# Patient Record
Sex: Male | Born: 1990 | Race: Black or African American | Hispanic: No | Marital: Married | Smoking: Never smoker
Health system: Southern US, Community
[De-identification: ages and names within clinical notes are randomized; demographics above are authoritative.]

## PROBLEM LIST (undated history)

## (undated) DIAGNOSIS — K2970 Gastritis, unspecified, without bleeding: Secondary | ICD-10-CM

---

## 2012-05-29 ENCOUNTER — Encounter (HOSPITAL_BASED_OUTPATIENT_CLINIC_OR_DEPARTMENT_OTHER): Payer: Self-pay | Admitting: *Deleted

## 2012-05-29 ENCOUNTER — Emergency Department (HOSPITAL_BASED_OUTPATIENT_CLINIC_OR_DEPARTMENT_OTHER)
Admission: EM | Admit: 2012-05-29 | Discharge: 2012-05-29 | Disposition: A | Discharge status: Home / Self Care | Payer: Self-pay | Attending: Emergency Medicine | Admitting: Emergency Medicine

## 2012-05-29 DIAGNOSIS — R110 Nausea: Secondary | ICD-10-CM

## 2012-05-29 DIAGNOSIS — K2970 Gastritis, unspecified, without bleeding: Secondary | ICD-10-CM

## 2012-05-29 DIAGNOSIS — E669 Obesity, unspecified: Secondary | ICD-10-CM | POA: Insufficient documentation

## 2012-05-29 LAB — URINALYSIS, ROUTINE W REFLEX MICROSCOPIC
Bilirubin Urine: NEGATIVE
Hgb urine dipstick: NEGATIVE
Ketones, ur: NEGATIVE mg/dL
Nitrite: NEGATIVE
Specific Gravity, Urine: 1.019 (ref 1.005–1.030)
Urobilinogen, UA: 0.2 mg/dL (ref 0.0–1.0)

## 2012-05-29 LAB — CBC WITH DIFFERENTIAL/PLATELET
Basophils Relative: 1 % (ref 0–1)
Eosinophils Absolute: 0.3 10*3/uL (ref 0.0–0.7)
Eosinophils Relative: 5 % (ref 0–5)
Lymphs Abs: 3 10*3/uL (ref 0.7–4.0)
MCH: 28.1 pg (ref 26.0–34.0)
MCHC: 34.9 g/dL (ref 30.0–36.0)
MCV: 80.4 fL (ref 78.0–100.0)
Monocytes Relative: 4 % (ref 3–12)
Neutrophils Relative %: 37 % — ABNORMAL LOW (ref 43–77)
Platelets: 189 10*3/uL (ref 150–400)
RBC: 4.7 MIL/uL (ref 4.22–5.81)

## 2012-05-29 LAB — COMPREHENSIVE METABOLIC PANEL
Albumin: 4 g/dL (ref 3.5–5.2)
BUN: 7 mg/dL (ref 6–23)
Calcium: 9.3 mg/dL (ref 8.4–10.5)
GFR calc Af Amer: >90 mL/min (ref >90)
Glucose, Bld: 86 mg/dL (ref 70–99)
Potassium: 3.7 mEq/L (ref 3.5–5.1)
Sodium: 137 mEq/L (ref 135–145)
Total Protein: 7.6 g/dL (ref 6.0–8.3)

## 2012-05-29 LAB — LIPASE, BLOOD: Lipase: 14 U/L (ref 11–59)

## 2012-05-29 MED ORDER — ONDANSETRON 4 MG PO TBDP: ORAL_TABLET | ORAL | Status: DC

## 2012-05-29 MED ORDER — ONDANSETRON HCL 4 MG/2ML IJ SOLN
4.0000 mg | Freq: Once | INTRAMUSCULAR | Status: AC
  Administered 2012-05-29: 4 mg via INTRAVENOUS
  Filled 2012-05-29: qty 2

## 2012-05-29 MED ORDER — FAMOTIDINE 20 MG PO TABS: 20.0000 mg | ORAL_TABLET | Freq: Every day | ORAL | Status: DC

## 2012-05-29 MED ORDER — SODIUM CHLORIDE 0.9 % IV BOLUS (SEPSIS)
1000.0000 mL | Freq: Once | INTRAVENOUS | Status: AC
  Administered 2012-05-29: 1000 mL via INTRAVENOUS

## 2012-05-29 MED ORDER — GI COCKTAIL ~~LOC~~
30.0000 mL | Freq: Once | ORAL | Status: AC
  Administered 2012-05-29: 30 mL via ORAL
  Filled 2012-05-29: qty 30

## 2012-05-29 MED ORDER — FAMOTIDINE IN NACL 20-0.9 MG/50ML-% IV SOLN
20.0000 mg | Freq: Once | INTRAVENOUS | Status: AC
  Administered 2012-05-29: 20 mg via INTRAVENOUS
  Filled 2012-05-29: qty 50

## 2012-05-29 NOTE — ED Notes (Signed)
Ok to insert IV in foot per Dr. Silverio Lay.

## 2012-05-29 NOTE — ED Notes (Signed)
Attempted IV x 2 unsuccessful for line and labs, MD aware

## 2012-05-29 NOTE — ED Notes (Signed)
Report received. Attempt IV stick times 2 without success.

## 2012-05-29 NOTE — ED Notes (Signed)
Pt describes generalized abd pain x 2 days "burning" Increased reflux and nausea. Took OTC med without relief.

## 2012-05-29 NOTE — ED Notes (Signed)
Per nurse instructions, I removed I.v. from patient's left foot and took another set of vitals.

## 2012-05-29 NOTE — ED Provider Notes (Signed)
History     CSN: [id number]  Arrival date & time 05/29/12  1449   First MD Initiated Contact with Patient 05/29/12 1722      Chief Complaint  Patient presents with  . Abdominal Pain    (Consider location/radiation/quality/duration/timing/severity/associated sxs/prior treatment) The history is provided by the patient.  Jacob Kemp is a 21 y.o. male here with abdominal pain and burning sensation. For a few days, he had diffuse abdominal pain. He also felt nauseous and noticed burning sensation after he eats. He took some motrin and that didn't help. No fever or vomiting. No constipation or diarrhea. No hx of gastric ulcer or reflux or gastritis. He denies eating spicy food.    History reviewed. No pertinent past medical history.  History reviewed. No pertinent past surgical history.  History reviewed. No pertinent family history.  History  Substance Use Topics  . Smoking status: Never Smoker   . Smokeless tobacco: Not on file  . Alcohol Use: No      Review of Systems  Gastrointestinal: Positive for nausea and abdominal pain.  All other systems reviewed and are negative.    Allergies  Review of patient's allergies indicates no known allergies.  Home Medications  No current outpatient prescriptions on file.  BP 145/73  Pulse 86  Temp 98.1 F (36.7 C) (Oral)  Resp 20  Ht 6' (1.829 m)  Wt 230 lb (104.327 kg)  BMI 31.19 kg/m2  SpO2 100%  Physical Exam  Nursing note and vitals reviewed. Constitutional: He is oriented to person, place, and time. He appears well-developed and well-nourished.  HENT:  Head: Normocephalic.  Mouth/Throat: Oropharynx is clear and moist.  Eyes: Conjunctivae normal are normal. Pupils are equal, round, and reactive to light.  Neck: Normal range of motion. Neck supple.  Cardiovascular: Normal rate, regular rhythm and normal heart sounds.   Pulmonary/Chest: Effort normal and breath sounds normal. No respiratory distress. He has no  wheezes.  Abdominal: Soft. Bowel sounds are normal.       Obese, nontender.   Musculoskeletal: Normal range of motion.  Neurological: He is alert and oriented to person, place, and time.  Skin: Skin is warm and dry.  Psychiatric: He has a normal mood and affect. His behavior is normal. Judgment and thought content normal.    ED Course  Procedures (including critical care time)  I performed an ED ultrasound guided peripheral access. I placed 20 gauge IV at R [facility] without complications.   Labs Reviewed  CBC WITH DIFFERENTIAL - Abnormal; Notable for the following:    HCT 37.8 (*)     Neutrophils Relative 37 (*)     Lymphocytes Relative 53 (*)     All other components within normal limits  URINALYSIS, ROUTINE W REFLEX MICROSCOPIC  COMPREHENSIVE METABOLIC PANEL  LIPASE, BLOOD   No results found.   No diagnosis found.    MDM  Jacob Kemp is a 21 y.o. male her with nausea, abdominal pain. Likely has reflux vs early gastroenteritis. Will check labs, give IVF and pepcid and zofran and reassess.   10:31 PM Patient felt well after fluids and meds. Labs nl, UA nl, lipase nl. Will d/c home with pepcid, zofran.        [name], MD 05/29/12 2232

## 2012-05-29 NOTE — ED Notes (Signed)
MD at bedside to obtain lab work

## 2015-02-15 ENCOUNTER — Encounter (HOSPITAL_BASED_OUTPATIENT_CLINIC_OR_DEPARTMENT_OTHER): Payer: Self-pay

## 2015-02-15 ENCOUNTER — Emergency Department (HOSPITAL_BASED_OUTPATIENT_CLINIC_OR_DEPARTMENT_OTHER)
Admission: EM | Admit: 2015-02-15 | Discharge: 2015-02-15 | Disposition: A | Discharge status: Home / Self Care | Payer: BLUE CROSS/BLUE SHIELD | Attending: Emergency Medicine | Admitting: Emergency Medicine

## 2015-02-15 DIAGNOSIS — K219 Gastro-esophageal reflux disease without esophagitis: Secondary | ICD-10-CM

## 2015-02-15 DIAGNOSIS — R12 Heartburn: Secondary | ICD-10-CM | POA: Diagnosis present

## 2015-02-15 HISTORY — DX: Gastritis, unspecified, without bleeding: K29.70

## 2015-02-15 MED ORDER — OMEPRAZOLE 40 MG PO CPDR: 40.0000 mg | DELAYED_RELEASE_CAPSULE | Freq: Every day | ORAL | Status: DC

## 2015-02-15 MED ORDER — PROMETHAZINE HCL 25 MG PO TABS: 25.0000 mg | ORAL_TABLET | Freq: Four times a day (QID) | ORAL | Status: DC | PRN

## 2015-02-15 MED ORDER — PANTOPRAZOLE SODIUM 40 MG PO TBEC
40.0000 mg | DELAYED_RELEASE_TABLET | Freq: Once | ORAL | Status: AC
  Administered 2015-02-15: 40 mg via ORAL
  Filled 2015-02-15: qty 1

## 2015-02-15 MED ORDER — SUCRALFATE 1 G PO TABS: 1.0000 g | ORAL_TABLET | Freq: Three times a day (TID) | ORAL | Status: DC

## 2015-02-15 MED ORDER — GI COCKTAIL ~~LOC~~
30.0000 mL | Freq: Once | ORAL | Status: AC
  Administered 2015-02-15: 30 mL via ORAL
  Filled 2015-02-15: qty 30

## 2015-02-15 MED ORDER — ONDANSETRON 4 MG PO TBDP
4.0000 mg | ORAL_TABLET | Freq: Once | ORAL | Status: AC
  Administered 2015-02-15: 4 mg via ORAL
  Filled 2015-02-15: qty 1

## 2015-02-15 NOTE — ED Notes (Signed)
C/o heartburn, nausea since last night-dx with gastritis "years ago" at [facility]

## 2015-02-15 NOTE — ED Provider Notes (Signed)
TIME SEEN: 8:25 PM  CHIEF COMPLAINT: Heartburn  HPI: Pt is a 24 y.o. male with history of gastritis who presents the emergency department with complaints of heartburn that started last night. Has had nausea as well. States it is a burning sensation in his chest and better so taste in his mouth. Has not taken anything at home for this. Denies vomiting or diarrhea. No bloody stool or melena. No abdominal pain. No shortness of breath. States she's had this before and a GI cocktail has helped.  ROS: See HPI Constitutional: no fever  Eyes: no drainage  ENT: no runny nose   Cardiovascular:  no chest pain  Resp: no SOB  GI: no vomiting GU: no dysuria Integumentary: no rash  Allergy: no hives  Musculoskeletal: no leg swelling  Neurological: no slurred speech ROS otherwise negative  PAST MEDICAL HISTORY/PAST SURGICAL HISTORY:  Past Medical History  Diagnosis Date  . Gastritis     MEDICATIONS:  Prior to Admission medications   Not on File    ALLERGIES:  No Known Allergies  SOCIAL HISTORY:  History  Substance Use Topics  . Smoking status: Never Smoker   . Smokeless tobacco: Not on file  . Alcohol Use: No    FAMILY HISTORY: No family history on file.  EXAM: BP 148/76 mmHg  Pulse 83  Temp(Src) 98.3 F (36.8 C) (Oral)  Resp 18  Ht 5\' 9"  (1.753 m)  Wt 320 lb (145.151 kg)  BMI 47.23 kg/m2  SpO2 97% CONSTITUTIONAL: Alert and oriented and responds appropriately to questions. Well-appearing; well-nourished, nontoxic, no distress HEAD: Normocephalic EYES: Conjunctivae clear, PERRL ENT: normal nose; no rhinorrhea; moist mucous membranes; pharynx without lesions noted NECK: Supple, no meningismus, no LAD  CARD: RRR; S1 and S2 appreciated; no murmurs, no clicks, no rubs, no gallops RESP: Normal chest excursion without splinting or tachypnea; breath sounds clear and equal bilaterally; no wheezes, no rhonchi, no rales, no hypoxia or respiratory distress, speaking full  sentences ABD/GI: Normal bowel sounds; non-distended; soft, non-tender, no rebound, no guarding, no peritoneal signs BACK:  The back appears normal and is non-tender to palpation, there is no CVA tenderness EXT: Normal ROM in all joints; non-tender to palpation; no edema; normal capillary refill; no cyanosis, no calf tenderness or swelling    SKIN: Normal color for age and race; warm NEURO: Moves all extremities equally, sensation to light touch intact diffusely, cranial nerves II through XII intact PSYCH: The patient's mood and manner are appropriate. Grooming and personal hygiene are appropriate.  MEDICAL DECISION MAKING: Patient here with GERD. Abdominal exam benign. I do not think this is ACS or pulmonary embolus. He is hemodynamically stable and afebrile. We'll give GI cocktail, Protonix and Zofran. His EKG shows no ischemic changes or arrhythmia. We'll reassess after these medications.  ED PROGRESS: Patient reports some improvement in symptoms after medications. Will discharge home with prescription for omeprazole, Carafate and Phenergan. We'll give him gastroenterology follow-up information. Discussed return precautions. He verbalizes understanding and is comfortable with plan.     EKG Interpretation  Date/Time:  Thursday February 15 2015 20:09:05 EDT Ventricular Rate:  78 PR Interval:  172 QRS Duration: 84 QT Interval:  372 QTC Calculation: 424 R Axis:   33 Text Interpretation:  Normal sinus rhythm Normal ECG No old tracing to compare Confirmed by WARD,  DO, KRISTEN [phone]) on 02/15/2015 8:09:41 PM        Layla Maw Ward, DO 02/15/15 2108

## 2015-02-15 NOTE — Discharge Instructions (Signed)
Food Choices for Gastroesophageal Reflux Disease When you have gastroesophageal reflux disease (GERD), the foods you eat and your eating habits are very important. Choosing the right foods can help ease the discomfort of GERD. WHAT GENERAL GUIDELINES DO I NEED TO FOLLOW?  Choose fruits, vegetables, whole grains, low-fat dairy products, and low-fat meat, fish, and poultry.  Limit fats such as oils, salad dressings, butter, nuts, and avocado.  Keep a food diary to identify foods that cause symptoms.  Avoid foods that cause reflux. These may be different for different people.  Eat frequent small meals instead of three large meals each day.  Eat your meals slowly, in a relaxed setting.  Limit fried foods.  Cook foods using methods other than frying.  Avoid drinking alcohol.  Avoid drinking large amounts of liquids with your meals.  Avoid bending over or lying down until 2-3 hours after eating. WHAT FOODS ARE NOT RECOMMENDED? The following are some foods and drinks that may worsen your symptoms: Vegetables Tomatoes. Tomato juice. Tomato and spaghetti sauce. Chili peppers. Onion and garlic. Horseradish. Fruits Oranges, grapefruit, and lemon (fruit and juice). Meats High-fat meats, fish, and poultry. This includes hot dogs, ribs, ham, sausage, salami, and bacon. Dairy Whole milk and chocolate milk. Sour cream. Cream. Butter. Ice cream. Cream cheese.  Beverages Coffee and tea, with or without caffeine. Carbonated beverages or energy drinks. Condiments Hot sauce. Barbecue sauce.  Sweets/Desserts Chocolate and cocoa. Donuts. Peppermint and spearmint. Fats and Oils High-fat foods, including French fries and potato chips. Other Vinegar. Strong spices, such as black pepper, white pepper, red pepper, cayenne, curry powder, cloves, ginger, and chili powder. The items listed above may not be a complete list of foods and beverages to avoid. Contact your dietitian for more  information. Document Released: 08/25/2005 Document Revised: 08/30/2013 Document Reviewed: 06/29/2013 Exit[employer] Patient Information 2015 [employer]. This information is not intended to replace advice given to you by your health care provider. Make sure you discuss any questions you have with your health care provider. Gastroesophageal Reflux Disease, Adult Gastroesophageal reflux disease (GERD) happens when acid from your stomach flows up into the esophagus. When acid comes in contact with the esophagus, the acid causes soreness (inflammation) in the esophagus. Over time, GERD may create small holes (ulcers) in the lining of the esophagus. CAUSES   Increased body weight. This puts pressure on the stomach, making acid rise from the stomach into the esophagus.  Smoking. This increases acid production in the stomach.  Drinking alcohol. This causes decreased pressure in the lower esophageal sphincter (valve or ring of muscle between the esophagus and stomach), allowing acid from the stomach into the esophagus.  Late evening meals and a full stomach. This increases pressure and acid production in the stomach.  A malformed lower esophageal sphincter. Sometimes, no cause is found. SYMPTOMS   Burning pain in the lower part of the mid-chest behind the breastbone and in the mid-stomach area. This may occur twice a week or more often.  Trouble swallowing.  Sore throat.  Dry cough.  Asthma-like symptoms including chest tightness, shortness of breath, or wheezing. DIAGNOSIS  Your caregiver may be able to diagnose GERD based on your symptoms. In some cases, X-rays and other tests may be done to check for complications or to check the condition of your stomach and esophagus. TREATMENT  Your caregiver may recommend over-the-counter or prescription medicines to help decrease acid production. Ask your caregiver before starting or adding any new medicines.  HOME CARE   INSTRUCTIONS   Change the  factors that you can control. Ask your caregiver for guidance concerning weight loss, quitting smoking, and alcohol consumption.  Avoid foods and drinks that make your symptoms worse, such as:  Caffeine or alcoholic drinks.  Chocolate.  Peppermint or mint flavorings.  Garlic and onions.  Spicy foods.  Citrus fruits, such as oranges, lemons, or limes.  Tomato-based foods such as sauce, chili, salsa, and pizza.  Fried and fatty foods.  Avoid lying down for the 3 hours prior to your bedtime or prior to taking a nap.  Eat small, frequent meals instead of large meals.  Wear loose-fitting clothing. Do not wear anything tight around your waist that causes pressure on your stomach.  Raise the head of your bed 6 to 8 inches with wood blocks to help you sleep. Extra pillows will not help.  Only take over-the-counter or prescription medicines for pain, discomfort, or fever as directed by your caregiver.  Do not take aspirin, ibuprofen, or other nonsteroidal anti-inflammatory drugs (NSAIDs). SEEK IMMEDIATE MEDICAL CARE IF:   You have pain in your arms, neck, jaw, teeth, or back.  Your pain increases or changes in intensity or duration.  You develop nausea, vomiting, or sweating (diaphoresis).  You develop shortness of breath, or you faint.  Your vomit is green, yellow, black, or looks like coffee grounds or blood.  Your stool is red, bloody, or black. These symptoms could be signs of other problems, such as heart disease, gastric bleeding, or esophageal bleeding. MAKE SURE YOU:   Understand these instructions.  Will watch your condition.  Will get help right away if you are not doing well or get worse. Document Released: 06/04/2005 Document Revised: 11/17/2011 Document Reviewed: 03/14/2011 ExitCare Patient Information 2015 [employer]. This information is not intended to replace advice given to you by your health care provider. Make sure you discuss any questions you have  with your health care provider.  

## 2015-02-27 ENCOUNTER — Emergency Department (HOSPITAL_BASED_OUTPATIENT_CLINIC_OR_DEPARTMENT_OTHER)
Admission: EM | Admit: 2015-02-27 | Discharge: 2015-02-27 | Disposition: A | Discharge status: Home / Self Care | Payer: BLUE CROSS/BLUE SHIELD | Attending: Emergency Medicine | Admitting: Emergency Medicine

## 2015-02-27 ENCOUNTER — Encounter (HOSPITAL_BASED_OUTPATIENT_CLINIC_OR_DEPARTMENT_OTHER): Payer: Self-pay | Admitting: Emergency Medicine

## 2015-02-27 ENCOUNTER — Emergency Department (HOSPITAL_BASED_OUTPATIENT_CLINIC_OR_DEPARTMENT_OTHER): Payer: BLUE CROSS/BLUE SHIELD

## 2015-02-27 DIAGNOSIS — K2100 Gastro-esophageal reflux disease with esophagitis, without bleeding: Secondary | ICD-10-CM

## 2015-02-27 DIAGNOSIS — Z79899 Other long term (current) drug therapy: Secondary | ICD-10-CM | POA: Diagnosis not present

## 2015-02-27 DIAGNOSIS — K219 Gastro-esophageal reflux disease without esophagitis: Secondary | ICD-10-CM | POA: Insufficient documentation

## 2015-02-27 DIAGNOSIS — M549 Dorsalgia, unspecified: Secondary | ICD-10-CM | POA: Insufficient documentation

## 2015-02-27 DIAGNOSIS — R079 Chest pain, unspecified: Secondary | ICD-10-CM | POA: Insufficient documentation

## 2015-02-27 LAB — CBC WITH DIFFERENTIAL/PLATELET
BASOS PCT: 0 % (ref 0–1)
Basophils Absolute: 0 10*3/uL (ref 0.0–0.1)
Eosinophils Absolute: 0.3 10*3/uL (ref 0.0–0.7)
Eosinophils Relative: 4 % (ref 0–5)
HCT: 41.1 % (ref 39.0–52.0)
HEMOGLOBIN: 13.6 g/dL (ref 13.0–17.0)
LYMPHS ABS: 3.4 10*3/uL (ref 0.7–4.0)
LYMPHS PCT: 49 % — AB (ref 12–46)
MCH: 27.5 pg (ref 26.0–34.0)
MCHC: 33.1 g/dL (ref 30.0–36.0)
MCV: 83 fL (ref 78.0–100.0)
MONOS PCT: 6 % (ref 3–12)
Monocytes Absolute: 0.4 10*3/uL (ref 0.1–1.0)
NEUTROS ABS: 2.8 10*3/uL (ref 1.7–7.7)
NEUTROS PCT: 41 % — AB (ref 43–77)
Platelets: 220 10*3/uL (ref 150–400)
RBC: 4.95 MIL/uL (ref 4.22–5.81)
RDW: 12.6 % (ref 11.5–15.5)
WBC: 6.9 10*3/uL (ref 4.0–10.5)

## 2015-02-27 LAB — COMPREHENSIVE METABOLIC PANEL
ALK PHOS: 38 U/L (ref 38–126)
ALT: 21 U/L (ref 17–63)
ANION GAP: 8 (ref 5–15)
AST: 21 U/L (ref 15–41)
Albumin: 3.9 g/dL (ref 3.5–5.0)
BUN: 11 mg/dL (ref 6–20)
CALCIUM: 9.2 mg/dL (ref 8.9–10.3)
CHLORIDE: 101 mmol/L (ref 101–111)
CO2: 29 mmol/L (ref 22–32)
Creatinine, Ser: 0.76 mg/dL (ref 0.61–1.24)
GLUCOSE: 79 mg/dL (ref 65–99)
POTASSIUM: 4.2 mmol/L (ref 3.5–5.1)
SODIUM: 138 mmol/L (ref 135–145)
Total Bilirubin: 0.4 mg/dL (ref 0.3–1.2)
Total Protein: 8.1 g/dL (ref 6.5–8.1)

## 2015-02-27 LAB — TROPONIN I

## 2015-02-27 LAB — LIPASE, BLOOD: Lipase: 12 U/L — ABNORMAL LOW (ref 22–51)

## 2015-02-27 MED ORDER — TRAMADOL HCL 50 MG PO TABS: 50.0000 mg | ORAL_TABLET | Freq: Four times a day (QID) | ORAL | Status: DC | PRN

## 2015-02-27 MED ORDER — SUCRALFATE 1 G PO TABS: 1.0000 g | ORAL_TABLET | Freq: Four times a day (QID) | ORAL | Status: DC

## 2015-02-27 NOTE — ED Notes (Signed)
Pt in c/o chest and back pain x 3 weeks. States pain is more oriented to the left, is sharp, and nothing make it better or worse. Pain does not increase with palpation. States he has had a cough recently.

## 2015-02-27 NOTE — ED Provider Notes (Signed)
CSN: [id number]     Arrival date & time 02/27/15  1736 History  This chart was scribed for Rolland Porter, MD by Octavia Heir, ED Scribe. This patient was seen in room MH11/MH11 and the patient's care was started at 6:20 PM.     Chief Complaint  Patient presents with  . Chest Pain  . Back Pain     Patient is a 24 y.o. male presenting with back pain. The history is provided by the patient. No language interpreter was used.  Back Pain Associated symptoms: no abdominal pain, no chest pain, no dysuria, no fever and no headaches     HPI Comments: Jacob Kemp is a 24 y.o. male who has a hx of gastritis presents to the Emergency Department complaining of intermittent, gradual worsening chest pain onset 3 weeks. Pt notes the pain is sharp and radiates to his back. He has associated shortness of breath that is intermittent. Pt notes when the pain comes, it lasts for about 15-20 minutes. Pt denies any aggravating and modifying factors for pain. Pt notes nothing alleviates the pain. Pt notes being seen here on 6/9 for similar symptoms and was told it was GERD and was given prilosec but reports nothing has changed. Pt is a non-smoker Pt denies abdominal pain, vomiting, etoh and daily caffeine intake.   Past Medical History  Diagnosis Date  . Gastritis    History reviewed. No pertinent past surgical history. History reviewed. No pertinent family history. History  Substance Use Topics  . Smoking status: Never Smoker   . Smokeless tobacco: Not on file  . Alcohol Use: No    Review of Systems  Constitutional: Negative for fever, chills, diaphoresis, appetite change and fatigue.  HENT: Negative for mouth sores, sore throat and trouble swallowing.   Eyes: Negative for visual disturbance.  Respiratory: Negative for cough, chest tightness, shortness of breath and wheezing.   Cardiovascular: Negative for chest pain.  Gastrointestinal: Negative for nausea, vomiting, abdominal pain, diarrhea and abdominal  distention.  Endocrine: Negative for polydipsia, polyphagia and polyuria.  Genitourinary: Negative for dysuria, frequency and hematuria.  Musculoskeletal: Positive for back pain. Negative for gait problem.  Skin: Negative for color change, pallor and rash.  Neurological: Negative for dizziness, syncope, light-headedness and headaches.  Hematological: Does not bruise/bleed easily.  Psychiatric/Behavioral: Negative for behavioral problems and confusion.  All other systems reviewed and are negative.     Allergies  Review of patient's allergies indicates no known allergies.  Home Medications   Prior to Admission medications   Medication Sig Start Date End Date Taking? Authorizing Provider  omeprazole (PRILOSEC) 40 MG capsule Take 1 capsule (40 mg total) by mouth daily. 02/15/15   Kristen N Ward, DO  promethazine (PHENERGAN) 25 MG tablet Take 1 tablet (25 mg total) by mouth every 6 (six) hours as needed for nausea or vomiting. 02/15/15   Kristen N Ward, DO  sucralfate (CARAFATE) 1 G tablet Take 1 tablet (1 g total) by mouth 4 (four) times daily. 02/27/15   Rolland Porter, MD  traMADol (ULTRAM) 50 MG tablet Take 1 tablet (50 mg total) by mouth every 6 (six) hours as needed. 02/27/15   Rolland Porter, MD    Triage vitals: BP 142/90 mmHg  Pulse 73  Temp(Src) 98.7 F (37.1 C) (Oral)  Resp 16  Ht 5\' 10"  (1.778 m)  Wt 317 lb (143.79 kg)  BMI 45.48 kg/m2  SpO2 100% Physical Exam  Constitutional: He is oriented to person, place, and time. He  appears well-developed and well-nourished. No distress.  HENT:  Head: Normocephalic.  Eyes: Conjunctivae are normal. Pupils are equal, round, and reactive to light. No scleral icterus.  Neck: Normal range of motion. Neck supple. No thyromegaly present.  Cardiovascular: Normal rate and regular rhythm.  Exam reveals no gallop and no friction rub.   No murmur heard. Pulmonary/Chest: Effort normal and breath sounds normal. No respiratory distress. He has no wheezes.  He has no rales.  Points to mid sternum, non tender normal exam  Abdominal: Soft. Bowel sounds are normal. He exhibits no distension. There is no tenderness. There is no rebound.  Morbidly obese, no tenderness,  Musculoskeletal: Normal range of motion.  Neurological: He is alert and oriented to person, place, and time.  Skin: Skin is warm and dry. No rash noted.  Psychiatric: He has a normal mood and affect. His behavior is normal.  Nursing note and vitals reviewed.   ED Course  Procedures  DIAGNOSTIC STUDIES: Oxygen Saturation is 100% on RA, normal by my interpretation.  COORDINATION OF CARE:  6:26 PM Discussed treatment plan which includes x-ray, repeat ekg and lab work with pt at bedside and pt agreed to plan.  Labs Review Labs Reviewed  CBC WITH DIFFERENTIAL/PLATELET - Abnormal; Notable for the following:    Neutrophils Relative % 41 (*)    Lymphocytes Relative 49 (*)    All other components within normal limits  LIPASE, BLOOD - Abnormal; Notable for the following:    Lipase 12 (*)    All other components within normal limits  COMPREHENSIVE METABOLIC PANEL  TROPONIN I    Imaging Review Dg Chest 2 View  02/27/2015   CLINICAL DATA:  Left-sided chest pain since June 9th. Shortness of breath. Initial encounter.  EXAM: CHEST  2 VIEW  COMPARISON:  None.  FINDINGS: Normal heart size and mediastinal contours. No acute infiltrate or edema. No effusion or pneumothorax. No acute osseous findings.  IMPRESSION: Negative chest.   Electronically Signed   By: Marnee Spring M.D.   On: 02/27/2015 18:10     EKG Interpretation None      MDM   Final diagnoses:  Chest pain, unspecified chest pain type  Gastroesophageal reflux disease with esophagitis   I personally performed the services described in this documentation, which was scribed in my presence. The recorded information has been reviewed and is accurate.   Rolland Porter, MD 02/27/15 [phone]

## 2015-02-27 NOTE — ED Notes (Signed)
Pt and ?wife report symptoms x 4 days.  Also report being seen here and [facility] for same.  Also seen by gastroenterologist yesterday and has an endoscopy scheduled.  Wife curtly while texting "we need to know whats going on.  I discussed with both that in an ED setting that might not be possible .

## 2015-02-27 NOTE — Discharge Instructions (Signed)
Gastroesophageal Reflux Disease, Adult Gastroesophageal reflux disease (GERD) happens when acid from your stomach goes into your food pipe (esophagus). The acid can cause a burning feeling in your chest. Over time, the acid can make small holes (ulcers) in your food pipe.  HOME CARE  Ask your doctor for advice about:  Losing weight.  Quitting smoking.  Alcohol use.  Avoid foods and drinks that make your problems worse. You may want to avoid:  Caffeine and alcohol.  Chocolate.  Mints.  Garlic and onions.  Spicy foods.  Citrus fruits, such as oranges, lemons, or limes.  Foods that contain tomato, such as sauce, chili, salsa, and pizza.  Fried and fatty foods.  Avoid lying down for 3 hours before you go to bed or before you take a nap.  Eat small meals often, instead of large meals.  Wear loose-fitting clothing. Do not wear anything tight around your waist.  Raise (elevate) the head of your bed 6 to 8 inches with wood blocks. Using extra pillows does not help.  Only take medicines as told by your doctor.  Do not take aspirin or ibuprofen. GET HELP RIGHT AWAY IF:   You have pain in your arms, neck, jaw, teeth, or back.  Your pain gets worse or changes.  You feel sick to your stomach (nauseous), throw up (vomit), or sweat (diaphoresis).  You feel short of breath, or you pass out (faint).  Your throw up is green, yellow, black, or looks like coffee grounds or blood.  Your poop (stool) is red, bloody, or black. MAKE SURE YOU:   Understand these instructions.  Will watch your condition.  Will get help right away if you are not doing well or get worse. Document Released: 02/11/2008 Document Revised: 11/17/2011 Document Reviewed: 03/14/2011 [facility] Patient Information 2015 [place], [place]. This information is not intended to replace advice given to you by your health care provider. Make sure you discuss any questions you have with your health care  provider.  Esophagitis Esophagitis is inflammation of the esophagus. It can involve swelling, soreness, and pain in the esophagus. This condition can make it difficult and painful to swallow. CAUSES  Most causes of esophagitis are not serious. Many different factors can cause esophagitis, including:  Gastroesophageal reflux disease (GERD). This is when acid from your stomach flows up into the esophagus.  Recurrent vomiting.  An allergic-type reaction.  Certain medicines, especially those that come in large pills.  Ingestion of harmful chemicals, such as household cleaning products.  Heavy alcohol use.  An infection of the esophagus.  Radiation treatment for cancer.  Certain diseases such as sarcoidosis, Crohn's disease, and scleroderma. These diseases may cause recurrent esophagitis. SYMPTOMS   Trouble swallowing.  Painful swallowing.  Chest pain.  Difficulty breathing.  Nausea.  Vomiting.  Abdominal pain. DIAGNOSIS  Your caregiver will take your history and do a physical exam. Depending upon what your caregiver finds, certain tests may also be done, including:  Barium X-ray. You will drink a solution that coats the esophagus, and X-rays will be taken.  Endoscopy. A lighted tube is put down the esophagus so your caregiver can examine the area.  Allergy tests. These can sometimes be arranged through follow-up visits. TREATMENT  Treatment will depend on the cause of your esophagitis. In some cases, steroids or other medicines may be given to help relieve your symptoms or to treat the underlying cause of your condition. Medicines that may be recommended include:  Viscous lidocaine, to soothe the esophagus.  Antacids.  Acid reducers.  Proton pump inhibitors.  Antiviral medicines for certain viral infections of the esophagus.  Antifungal medicines for certain fungal infections of the esophagus.  Antibiotic medicines, depending on the cause of the  esophagitis. HOME CARE INSTRUCTIONS   Avoid foods and drinks that seem to make your symptoms worse.  Eat small, frequent meals instead of large meals.  Avoid eating for the 3 hours prior to your bedtime.  If you have trouble taking pills, use a pill splitter to decrease the size and likelihood of the pill getting stuck or injuring the esophagus on the way down. Drinking water after taking a pill also helps.  Stop smoking if you smoke.  Maintain a healthy weight.  Wear loose-fitting clothing. Do not wear anything tight around your waist that causes pressure on your stomach.  Raise the head of your bed 6 to 8 inches with wood blocks to help you sleep. Extra pillows will not help.  Only take over-the-counter or prescription medicines as directed by your caregiver. SEEK IMMEDIATE MEDICAL CARE IF:  You have severe chest pain that radiates into your arm, neck, or jaw.  You feel sweaty, dizzy, or lightheaded.  You have shortness of breath.  You vomit blood.  You have difficulty or pain with swallowing.  You have bloody or black, tarry stools.  You have a fever.  You have a burning sensation in the chest more than 3 times a week for more than 2 weeks.  You cannot swallow, drink, or eat.  You drool because you cannot swallow your saliva. MAKE SURE YOU:  Understand these instructions.  Will watch your condition.  Will get help right away if you are not doing well or get worse. Document Released: 10/02/2004 Document Revised: 11/17/2011 Document Reviewed: 04/25/2011 [facility] Patient Information 2015 [place], [place]. This information is not intended to replace advice given to you by your health care provider. Make sure you discuss any questions you have with your health care provider.

## 2016-01-18 ENCOUNTER — Encounter (HOSPITAL_BASED_OUTPATIENT_CLINIC_OR_DEPARTMENT_OTHER): Payer: Self-pay

## 2016-01-18 ENCOUNTER — Emergency Department (HOSPITAL_BASED_OUTPATIENT_CLINIC_OR_DEPARTMENT_OTHER): Payer: Self-pay

## 2016-01-18 ENCOUNTER — Emergency Department (HOSPITAL_BASED_OUTPATIENT_CLINIC_OR_DEPARTMENT_OTHER)
Admission: EM | Admit: 2016-01-18 | Discharge: 2016-01-18 | Disposition: A | Discharge status: Home / Self Care | Payer: Self-pay | Attending: Emergency Medicine | Admitting: Emergency Medicine

## 2016-01-18 DIAGNOSIS — M79662 Pain in left lower leg: Secondary | ICD-10-CM | POA: Insufficient documentation

## 2016-01-18 DIAGNOSIS — M79605 Pain in left leg: Secondary | ICD-10-CM

## 2016-01-18 MED ORDER — IBUPROFEN 800 MG PO TABS: 800.0000 mg | ORAL_TABLET | Freq: Three times a day (TID) | ORAL | Status: DC

## 2016-01-18 NOTE — Discharge Instructions (Signed)

## 2016-01-18 NOTE — ED Notes (Signed)
C/i left calf pain "for weeks"-denies injury_NAD- steady gait

## 2016-01-18 NOTE — ED Provider Notes (Signed)
CSN: [id number]     Arrival date & time 01/18/16  1111 History   First MD Initiated Contact with Patient 01/18/16 1129     Chief Complaint  Patient presents with  . Leg Pain   HPI  Mr. Zahler is a 25 year old male with PMHx of gastritis presenting with leg pain. Onset of symptoms was 3 weeks ago. The pain is located in the posterior left calf. The pain does not radiate. He describes the pain as a cramping sensation. The pain is constant. Ambulating exacerbates the pain. Denies alleviating factors. He has tried tylenol without improvement. Denies known injury to the calf. Denies recent over-exertion or new work out regimes. He reports intermittent tingling in the left foot. Denies weakness, numbness, loss of sensation or color change in the left leg. He reports a family history of blood clots but no personal history. Denies recent immobilization, long travel, unilateral calf swelling, chest pain, SOB or palpitations. He has no other complaints today.  Past Medical History  Diagnosis Date  . Gastritis    History reviewed. No pertinent past surgical history. No family history on file. Social History  Substance Use Topics  . Smoking status: Never Smoker   . Smokeless tobacco: None  . Alcohol Use: No    Review of Systems  All other systems reviewed and are negative.  Allergies  Review of patient's allergies indicates no known allergies.  Home Medications   Prior to Admission medications   Medication Sig Start Date End Date Taking? Authorizing Provider  ibuprofen (ADVIL,MOTRIN) 800 MG tablet Take 1 tablet (800 mg total) by mouth 3 (three) times daily. 01/18/16   Javiana Anwar, PA-C   BP 119/78 mmHg  Pulse 64  Temp(Src) 99.2 F (37.3 C) (Oral)  Resp 20  Ht  (1.753 m)  Wt 142.883 kg  BMI 46.50 kg/m2  SpO2 100% Physical Exam  Constitutional: He appears well-developed and well-nourished. No distress.  HENT:  Head: Normocephalic and atraumatic.  Eyes: Conjunctivae are normal.  Right eye exhibits no discharge. Left eye exhibits no discharge. No scleral icterus.  Neck: Normal range of motion.  Cardiovascular: Normal rate, regular rhythm, normal heart sounds and intact distal pulses.   Pedal pulse palpable  Pulmonary/Chest: Effort normal. No respiratory distress.  Musculoskeletal: Normal range of motion.       Left lower leg: He exhibits tenderness. He exhibits no swelling.       Legs: TTP of posterior left calf. No erythema, warmth or palpable cords. No unilateral swelling. FROM at the left knee, ankle and toes intact. Negative Homan's. Pt ambulates with a steady gait.   Neurological: He is alert. Coordination normal.  5/5 strength of BLE. Sensation to light touch intact over lower legs and feet  Skin: Skin is warm and dry.  No color or temperature changes over the left foot  Psychiatric: He has a normal mood and affect. His behavior is normal.  Nursing note and vitals reviewed.   ED Course  Procedures (including critical care time) Labs Review Labs Reviewed - No data to display  Imaging Review US Venous Img Lower Unilateral Left  01/18/2016  CLINICAL DATA:  One month history of left calf region pain EXAM: LEFT LOWER EXTREMITY VENOUS DUPLEX ULTRASOUND TECHNIQUE: Gray-scale sonography with graded compression, as well as color Doppler and duplex ultrasound were performed to evaluate the left lower extremity deep venous system from the level of the common femoral vein and including the common femoral, femoral, profunda femoral, popliteal  and calf veins including the posterior tibial, peroneal and gastrocnemius veins when visible. The superficial great saphenous vein was also interrogated. Spectral Doppler was utilized to evaluate flow at rest and with distal augmentation maneuvers in the common femoral, femoral and popliteal veins. COMPARISON:  None. FINDINGS: Contralateral Common Femoral Vein: Respiratory phasicity is normal and symmetric with the symptomatic side. No  evidence of thrombus. Normal compressibility. Common Femoral Vein: No evidence of thrombus. Normal compressibility, respiratory phasicity and response to augmentation. Saphenofemoral Junction: No evidence of thrombus. Normal compressibility and flow on color Doppler imaging. Profunda Femoral Vein: No evidence of thrombus. Normal compressibility and flow on color Doppler imaging. Femoral Vein: No evidence of thrombus. Normal compressibility, respiratory phasicity and response to augmentation. Popliteal Vein: No evidence of thrombus. Normal compressibility, respiratory phasicity and response to augmentation. Calf Veins: No evidence of thrombus. Normal compressibility and flow on color Doppler imaging. Superficial Great Saphenous Vein: No evidence of thrombus. Normal compressibility and flow on color Doppler imaging. Venous Reflux:  None. Other Findings:  None. IMPRESSION: No evidence of left lower extremity deep venous thrombosis. Right common femoral vein also patent. Electronically Signed   By: Bretta BangWilliam  Woodruff III M.D.   On: 01/18/2016 14:20   I have personally reviewed and evaluated these images and lab results as part of my medical decision-making.   EKG Interpretation None      MDM   Final diagnoses:  Left leg pain   Patient presenting with left calf pain x 3 weeks. Left lower leg is neurovascularly intact with FROM. TTP over left posterior calf. No bony or joint tenderness. No unilateral swelling, erythema or warmth. Negative Homan's. Ultrasound negative for DVT. Pain managed in ED with ibuprofen. Pt is able to ambulate with a steady gait. Conservative therapy recommended at this time. Pt advised to follow up with PCP or orthopedics if symptoms persist. Return precautions discussed at bedside and given in discharge paperwork. Pt is stable for discharge.     Rolm GalaStevi Avaleigh Decuir, PA-C 01/18/16 1511  Rolan BuccoMelanie Belfi, MD 01/18/16 1513

## 2016-12-02 IMAGING — DX DG CHEST 2V
2 series · 2 of 2 positions shown · non-contrast
Comparison: None.

CLINICAL DATA: Left-sided chest pain since [REDACTED]. Shortness of
breath. Initial encounter.

EXAM:
CHEST  2 VIEW

[chest pa]
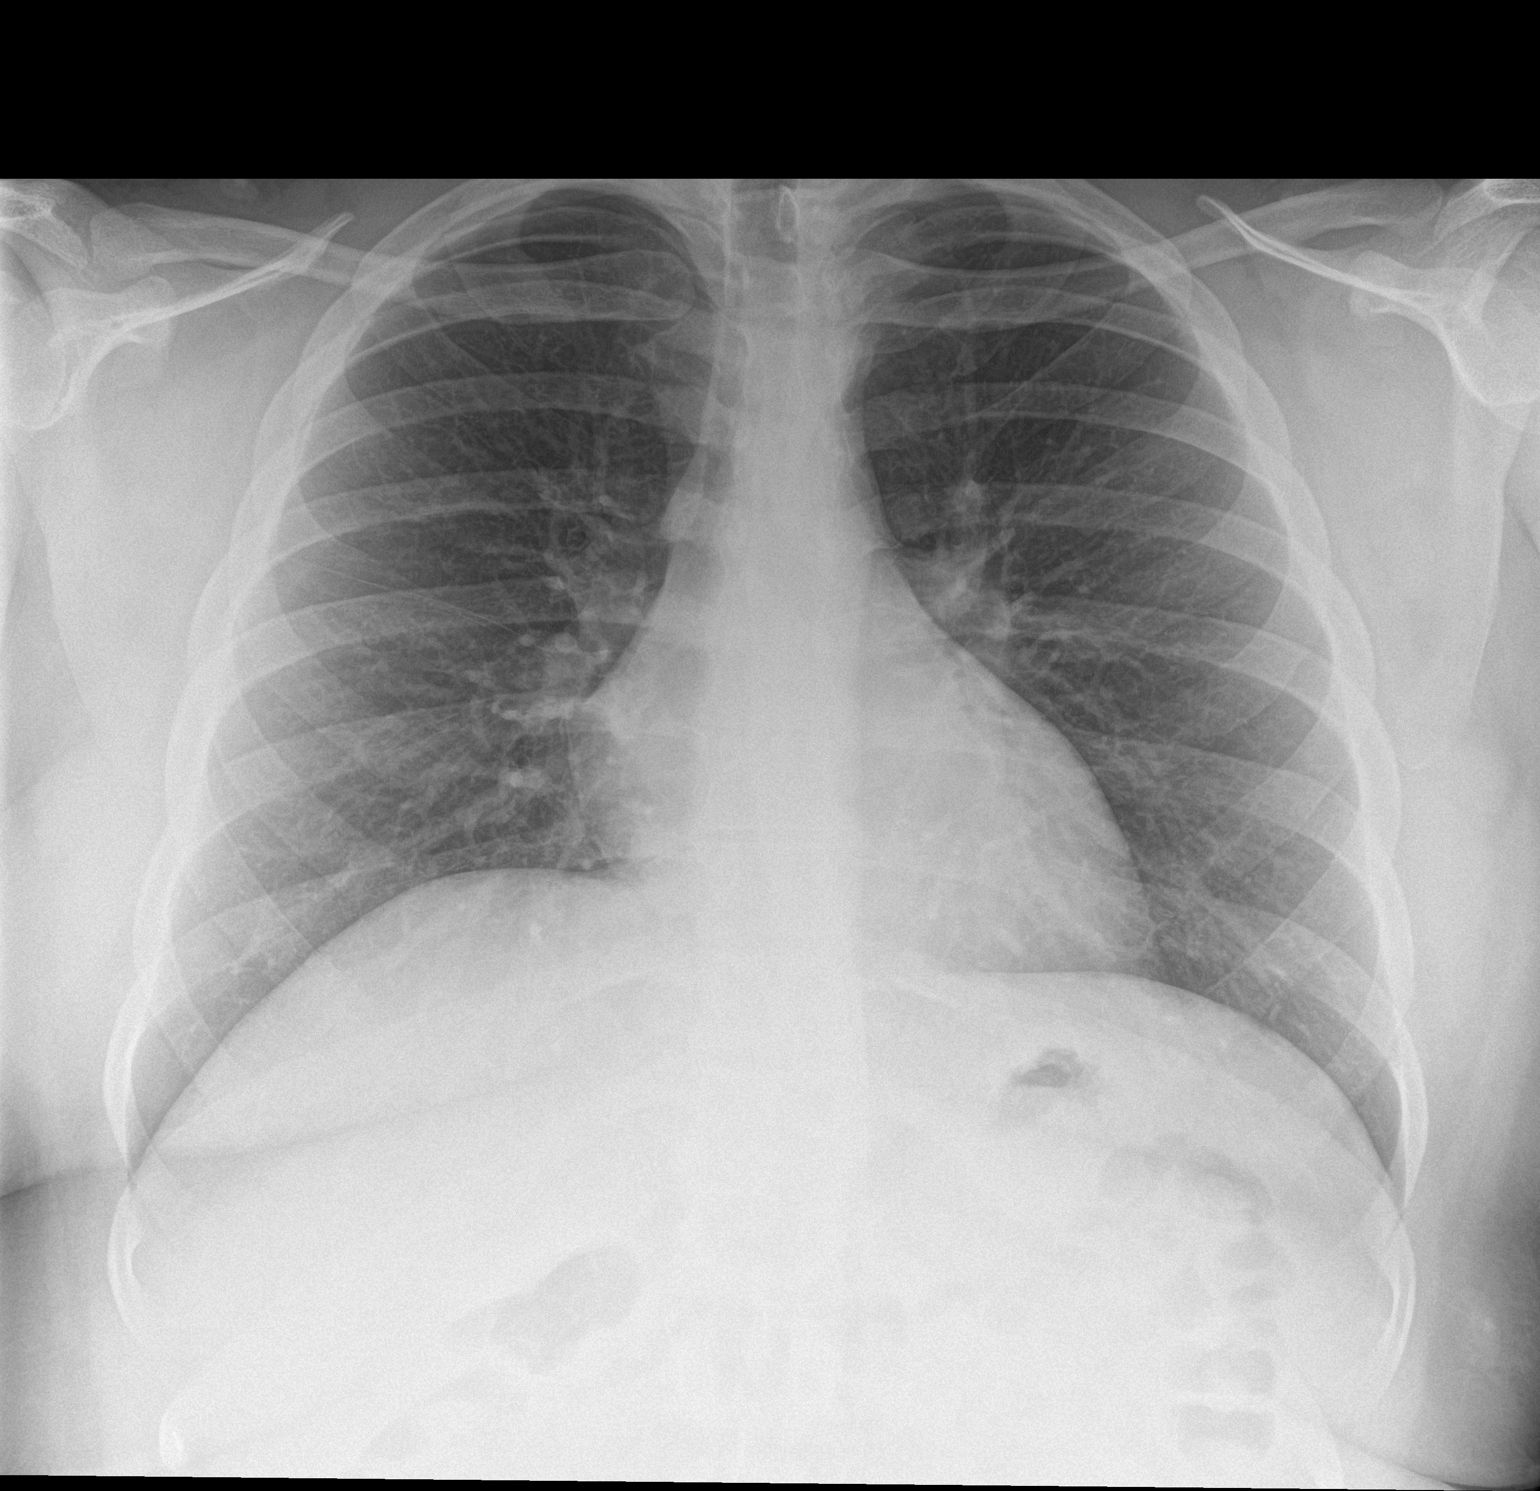

[chest lat]
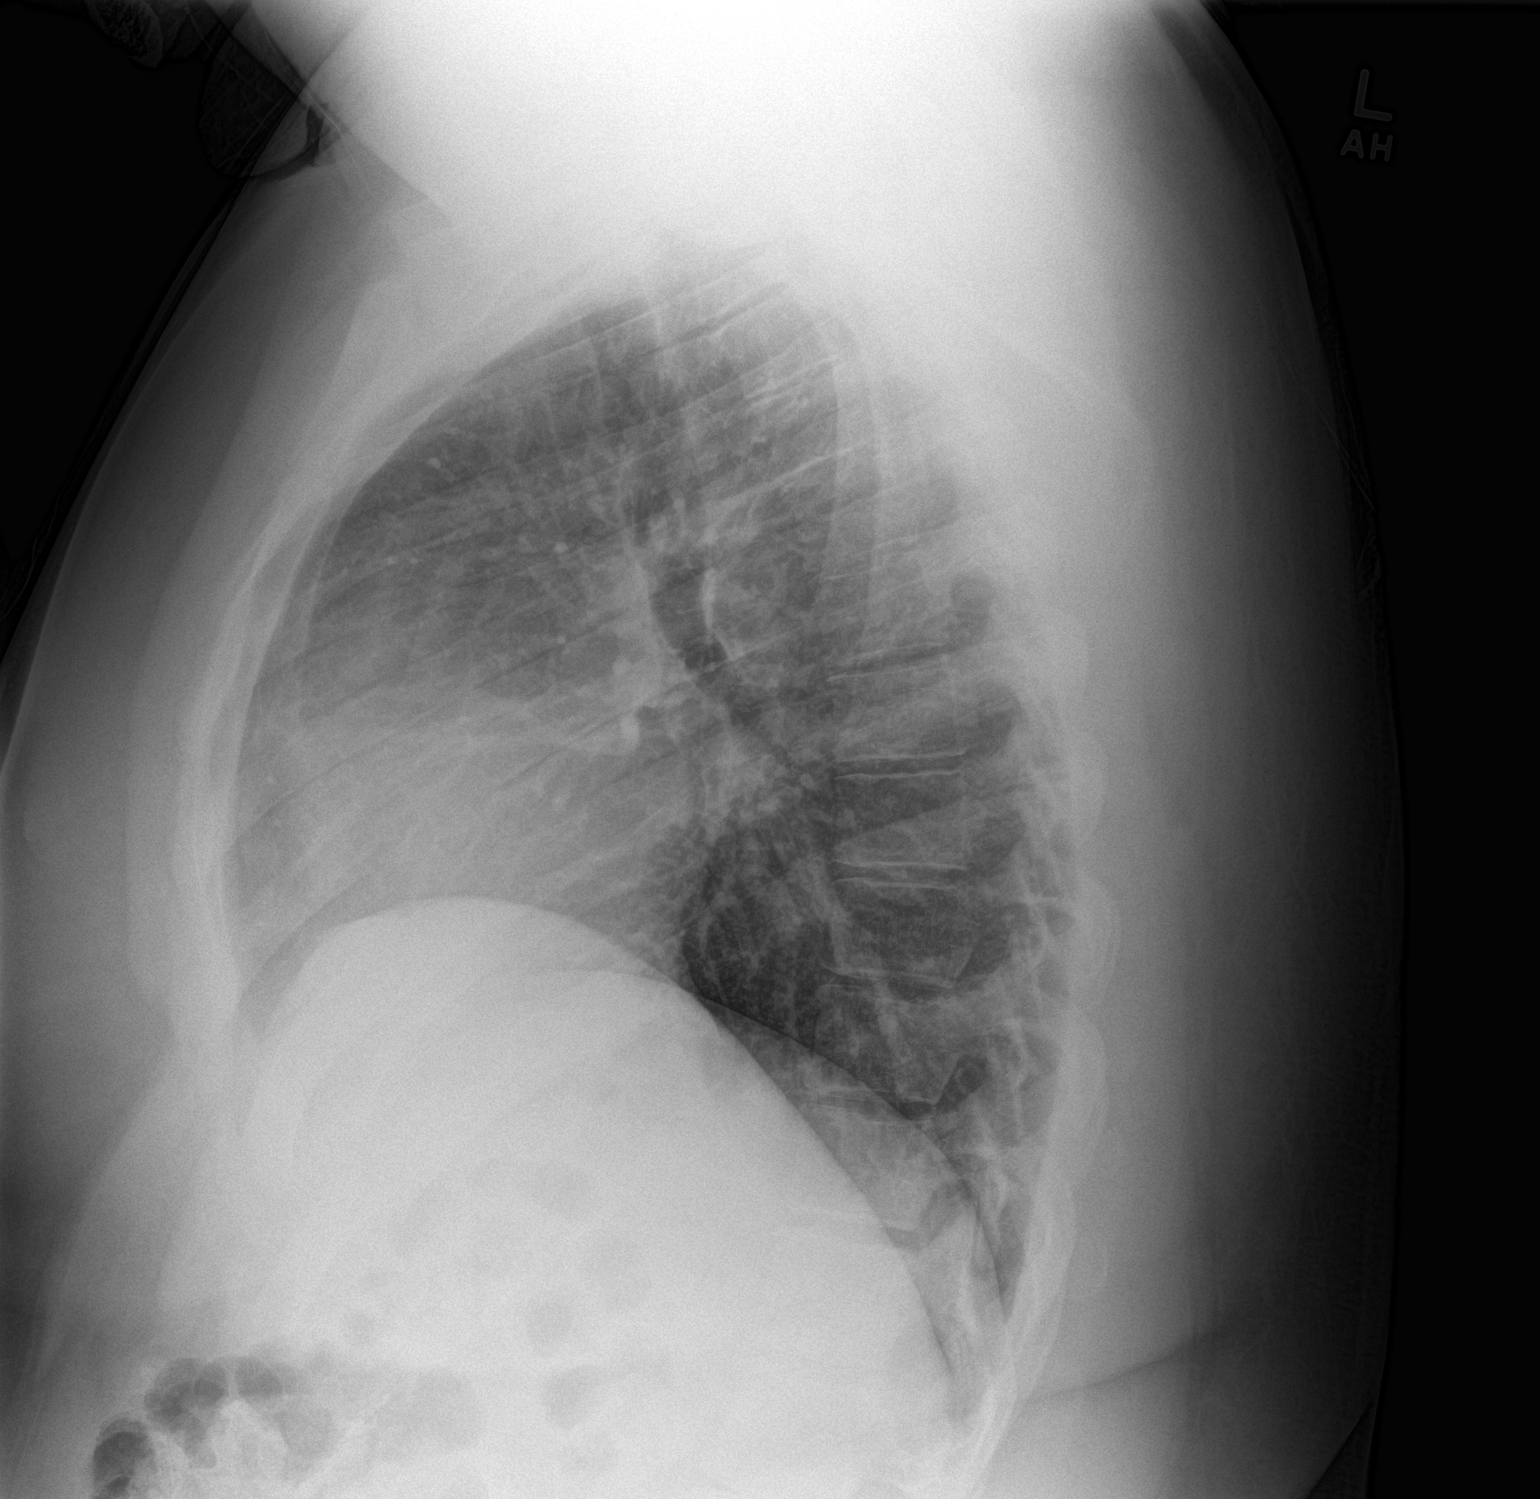

[2 of 2 positions shown; findings below may reference images not displayed]

FINDINGS: Normal heart size and mediastinal contours. No acute infiltrate or
edema. No effusion or pneumothorax. No acute osseous findings.
IMPRESSION: Negative chest.

## 2016-12-09 ENCOUNTER — Emergency Department (HOSPITAL_BASED_OUTPATIENT_CLINIC_OR_DEPARTMENT_OTHER): Payer: Self-pay

## 2016-12-09 ENCOUNTER — Emergency Department (HOSPITAL_BASED_OUTPATIENT_CLINIC_OR_DEPARTMENT_OTHER)
Admission: EM | Admit: 2016-12-09 | Discharge: 2016-12-09 | Disposition: A | Discharge status: Home / Self Care | Payer: Self-pay | Attending: Emergency Medicine | Admitting: Emergency Medicine

## 2016-12-09 ENCOUNTER — Encounter (HOSPITAL_BASED_OUTPATIENT_CLINIC_OR_DEPARTMENT_OTHER): Payer: Self-pay

## 2016-12-09 DIAGNOSIS — N3001 Acute cystitis with hematuria: Secondary | ICD-10-CM | POA: Insufficient documentation

## 2016-12-09 LAB — URINALYSIS, ROUTINE W REFLEX MICROSCOPIC
Bilirubin Urine: NEGATIVE
Glucose, UA: NEGATIVE mg/dL
Ketones, ur: 15 mg/dL — AB
Nitrite: NEGATIVE
Protein, ur: NEGATIVE mg/dL
Specific Gravity, Urine: 1.029 (ref 1.005–1.030)
pH: 6 (ref 5.0–8.0)

## 2016-12-09 LAB — URINALYSIS, MICROSCOPIC (REFLEX)

## 2016-12-09 MED ORDER — CEPHALEXIN 500 MG PO CAPS: 500.0000 mg | ORAL_CAPSULE | Freq: Three times a day (TID) | ORAL | 0 refills | Status: AC

## 2016-12-09 MED ORDER — LIDOCAINE HCL (PF) 1 % IJ SOLN
INTRAMUSCULAR | Status: AC
  Administered 2016-12-09: 5 mL
  Filled 2016-12-09: qty 5

## 2016-12-09 MED ORDER — CEFTRIAXONE SODIUM 1 G IJ SOLR
1.0000 g | Freq: Once | INTRAMUSCULAR | Status: AC
  Administered 2016-12-09: 1 g via INTRAMUSCULAR
  Filled 2016-12-09: qty 10

## 2016-12-09 MED ORDER — AZITHROMYCIN 250 MG PO TABS
1000.0000 mg | ORAL_TABLET | Freq: Once | ORAL | Status: AC
  Administered 2016-12-09: 1000 mg via ORAL
  Filled 2016-12-09: qty 4

## 2016-12-09 NOTE — ED Provider Notes (Signed)
[facility] Provider Note   CSN: [id number] Arrival date & time: 12/09/16  1222     History   Chief Complaint Chief Complaint  Patient presents with  . Hematuria    HPI Jacob Kemp is a 26 y.o. male.  HPI Patient presents the emergency department with complaints of blood and pus coming from his penis over the past 5 days.  He also reports increasing left flank pain with radiation.  No penile lesions.  No scrotal pain.  He is married and has had no new sexual contacts.  No history of STD.  No history of recurrent urinary tract infections.  No fevers or chills.  Denies significant abdominal pain.  Denies nausea vomiting  Past Medical History:  Diagnosis Date  . Gastritis     There are no active problems to display for this patient.   History reviewed. No pertinent surgical history.     Home Medications    Prior to Admission medications   Medication Sig Start Date End Date Taking? Authorizing Provider  cephALEXin (KEFLEX) 500 MG capsule Take 1 capsule (500 mg total) by mouth 3 (three) times daily. 12/09/16   Azalia Bilis, MD    Family History No family history on file.  Social History Social History  Substance Use Topics  . Smoking status: Never Smoker  . Smokeless tobacco: Never Used  . Alcohol use No     Allergies   Patient has no known allergies.   Review of Systems Review of Systems  All other systems reviewed and are negative.    Physical Exam Updated Vital Signs BP (!) 152/81 (BP Location: Right Arm)   Pulse 85   Temp 98.4 F (36.9 C) (Oral)   Resp 18   Ht 6' (1.829 m)   Wt (!) 328 lb (148.8 kg)   SpO2 100%   BMI 44.48 kg/m   Physical Exam  Constitutional: He is oriented to person, place, and time. He appears well-developed and well-nourished.  HENT:  Head: Normocephalic.  Eyes: EOM are normal.  Neck: Normal range of motion.  Pulmonary/Chest: Effort normal.  Abdominal: He exhibits no distension.  Mild tenderness of the  left lower quadrant  Genitourinary:  Genitourinary Comments: No significant urethral discharge noted.  Penis and scrotum are otherwise normal.  No penile tenderness.  Musculoskeletal: Normal range of motion.  Neurological: He is alert and oriented to person, place, and time.  Psychiatric: He has a normal mood and affect.  Nursing note and vitals reviewed.    ED Treatments / Results  Labs (all labs ordered are listed, but only abnormal results are displayed) Labs Reviewed  URINALYSIS, ROUTINE W REFLEX MICROSCOPIC - Abnormal; Notable for the following:       Result Value   APPearance CLOUDY (*)    Hgb urine dipstick SMALL (*)    Ketones, ur 15 (*)    Leukocytes, UA MODERATE (*)    All other components within normal limits  URINALYSIS, MICROSCOPIC (REFLEX) - Abnormal; Notable for the following:    Bacteria, UA MANY (*)    Squamous Epithelial / LPF 0-5 (*)    All other components within normal limits  URINE CULTURE  GC/CHLAMYDIA PROBE AMP ([place]) NOT AT [facility]    EKG  EKG Interpretation None       Radiology Ct Renal Stone Study  Result Date: 12/09/2016 CLINICAL DATA:  Left flank pain and hematuria for 1 week EXAM: CT ABDOMEN AND PELVIS WITHOUT CONTRAST TECHNIQUE: Multidetector CT imaging of  the abdomen and pelvis was performed following the standard protocol without oral or intravenous contrast material administration. COMPARISON:  None. FINDINGS: Lower chest: There is slight atelectatic change in the posterior left base. Lungs bases elsewhere are clear. Hepatobiliary: No focal liver lesions are evident on this noncontrast enhanced study. Gallbladder wall is not appreciably thickened. There is no biliary duct dilatation. Pancreas: There is no pancreatic mass or inflammatory focus. Spleen: No splenic lesions are evident. Adrenals/Urinary Tract: Adrenals appear normal bilaterally. Kidneys bilaterally show no evident mass or hydronephrosis on either side. There is no evident renal  or ureteral calculus on either side. There are several phleboliths in the left pelvis which are near but felt to be separate from the distal left ureter. Urinary bladder is midline with wall thickness within normal limits given degree of urinary bladder distention. Stomach/Bowel: There is no appreciable bowel wall thickening or mesenteric thickening. There is no bowel obstruction. There is no appreciable free air or portal venous air. Vascular/Lymphatic: There is no abdominal aortic aneurysm. No vascular lesions are evident. There are scattered borderline prominent inguinal lymph nodes bilaterally, with the largest inguinal lymph node on the right measuring 1.2 x 1.1 cm. No other lymph node prominence is evident in the abdomen or pelvis. Reproductive: Prostate and seminal vesicles appear normal in size and contour. There is no pelvic mass. Other: The appendix appears normal. No ascites or abscess is evident in the abdomen or pelvis. There is a small ventral hernia containing only fat. A small amount of air in the fatty tissues of the posterior lower abdomen likely is due to the recent injection. A small amount of apparent hematoma is present in this area. Musculoskeletal: There is mild lumbar levoscoliosis. There are no blastic or lytic bone lesions. There is no intramuscular lesion. IMPRESSION: There is no appreciable renal or ureteral calculus. No evident hydronephrosis. Urinary bladder wall not appreciably thickened. A cause for hematuria has not been established with this study. No bowel obstruction.  No abscess.  Appendix appears normal. Small amount of air in the posterior lower abdominal region within the fat. Suspect injection focus with small associated hematoma. Electronically Signed   By: Bretta Bang III M.D.   On: 12/09/2016 14:16    Procedures Procedures (including critical care time)  Medications Ordered in ED Medications  cefTRIAXone (ROCEPHIN) injection 1 g (1 g Intramuscular Given  12/09/16 1340)  azithromycin (ZITHROMAX) tablet 1,000 mg (1,000 mg Oral Given 12/09/16 1340)  lidocaine (PF) (XYLOCAINE) 1 % injection (5 mLs  Given 12/09/16 1340)     Initial Impression / Assessment and Plan / ED Course  I have reviewed the triage vital signs and the nursing notes.  Pertinent labs & imaging results that were available during my care of the patient were reviewed by me and considered in my medical decision making (see chart for details).     Patient given Rocephin and azithromycin.  Urine sent for culture.  Home with 3 times a day Keflex 7 days.  Overall well-appearing.  Discharge home in good condition.  Treated for GC and chlamydia.  Testing obtained  Final Clinical Impressions(s) / ED Diagnoses   Final diagnoses:  Acute cystitis with hematuria    New Prescriptions New Prescriptions   CEPHALEXIN (KEFLEX) 500 MG CAPSULE    Take 1 capsule (500 mg total) by mouth 3 (three) times daily.     Azalia Bilis, MD 12/09/16 [phone]

## 2016-12-09 NOTE — ED Triage Notes (Signed)
c/o intermittent "blood and pus" in urine x 5 days-NAD-steady gait

## 2016-12-10 LAB — GC/CHLAMYDIA PROBE AMP (~~LOC~~) NOT AT ARMC
Chlamydia: NEGATIVE
Neisseria Gonorrhea: NEGATIVE

## 2016-12-11 LAB — URINE CULTURE: Culture: <10000 — AB

## 2016-12-11 LAB — GC/CHLAMYDIA PROBE AMP (~~LOC~~) NOT AT ARMC
CHLAMYDIA, DNA PROBE: NEGATIVE
NEISSERIA GONORRHEA: NEGATIVE
